# Patient Record
Sex: Male | Born: 1970 | Hispanic: Yes | Marital: Single
Health system: Southern US, Community
[De-identification: ages and names within clinical notes are randomized; demographics above are authoritative.]

## PROBLEM LIST (undated history)

## (undated) DIAGNOSIS — E119 Type 2 diabetes mellitus without complications: Secondary | ICD-10-CM

## (undated) DIAGNOSIS — I10 Essential (primary) hypertension: Secondary | ICD-10-CM

---

## 2004-12-03 ENCOUNTER — Ambulatory Visit: Payer: Self-pay | Admitting: Family Medicine

## 2009-02-04 ENCOUNTER — Emergency Department: Payer: Self-pay | Admitting: Emergency Medicine

## 2012-02-08 ENCOUNTER — Ambulatory Visit: Payer: Self-pay | Admitting: Primary Care

## 2019-07-05 ENCOUNTER — Other Ambulatory Visit: Payer: Self-pay

## 2019-07-05 DIAGNOSIS — Z20822 Contact with and (suspected) exposure to covid-19: Secondary | ICD-10-CM

## 2019-07-10 LAB — NOVEL CORONAVIRUS, NAA: SARS-CoV-2, NAA: NOT DETECTED

## 2020-04-22 ENCOUNTER — Ambulatory Visit: Payer: Self-pay | Attending: Internal Medicine

## 2020-04-22 DIAGNOSIS — Z23 Encounter for immunization: Secondary | ICD-10-CM

## 2020-04-22 NOTE — Progress Notes (Signed)
   Covid-19 Vaccination [facility]  Name:  Bryan Skinner    MRN: [id number] DOB: Jul 18, 1971  04/22/2020  [name] was observed post Covid-19 immunization for 15 minutes without incident. He was provided with Vaccine Information Sheet and instruction to access the V-Safe system.   [name] was instructed to call 911 with any severe reactions post vaccine: Marland Kitchen Difficulty breathing  . Swelling of face and throat  . A fast heartbeat  . A bad rash all over body  . Dizziness and weakness   Immunizations Administered    Name Date Dose VIS Date Route   Pfizer COVID-19 Vaccine 04/22/2020  1:00 PM 0.3 mL 02/13/2019 Intramuscular   Manufacturer: [employer], [employer]   Lot: [id number]   NDC: [address]-3

## 2020-05-13 ENCOUNTER — Ambulatory Visit: Payer: Self-pay | Attending: Internal Medicine

## 2020-05-13 DIAGNOSIS — Z23 Encounter for immunization: Secondary | ICD-10-CM

## 2020-05-13 NOTE — Progress Notes (Signed)
   Covid-19 Vaccination Clinic  Name:  Bryan Skinner    MRN: [id number] DOB: 09-Dec-1971  05/13/2020  Mr. Bryan Skinner was observed post Covid-19 immunization for 15 minutes without incident. He was provided with Vaccine Information Sheet and instruction to access the V-Safe system.   Mr. Bryan Skinner was instructed to call 911 with any severe reactions post vaccine: Marland Kitchen Difficulty breathing  . Swelling of face and throat  . A fast heartbeat  . A bad rash all over body  . Dizziness and weakness   Immunizations Administered    Name Date Dose VIS Date Route   Pfizer COVID-19 Vaccine 05/13/2020 12:43 PM 0.3 mL 02/13/2019 Intramuscular   Manufacturer: [employer], [employer]   Lot: [id number]   NDC: [address]-5

## 2021-12-16 ENCOUNTER — Other Ambulatory Visit: Payer: Self-pay

## 2021-12-16 ENCOUNTER — Emergency Department: Payer: Self-pay

## 2021-12-16 DIAGNOSIS — R110 Nausea: Secondary | ICD-10-CM | POA: Insufficient documentation

## 2021-12-16 DIAGNOSIS — R1013 Epigastric pain: Secondary | ICD-10-CM | POA: Insufficient documentation

## 2021-12-16 DIAGNOSIS — Z5321 Procedure and treatment not carried out due to patient leaving prior to being seen by health care provider: Secondary | ICD-10-CM | POA: Insufficient documentation

## 2021-12-16 LAB — BASIC METABOLIC PANEL
Anion gap: 12 (ref 5–15)
BUN: 16 mg/dL (ref 6–20)
CO2: 23 mmol/L (ref 22–32)
Calcium: 8.6 mg/dL — ABNORMAL LOW (ref 8.9–10.3)
Chloride: 97 mmol/L — ABNORMAL LOW (ref 98–111)
Creatinine, Ser: 0.87 mg/dL (ref 0.61–1.24)
GFR, Estimated: >60 mL/min (ref >60)
Glucose, Bld: 132 mg/dL — ABNORMAL HIGH (ref 70–99)
Potassium: 3.1 mmol/L — ABNORMAL LOW (ref 3.5–5.1)
Sodium: 132 mmol/L — ABNORMAL LOW (ref 135–145)

## 2021-12-16 LAB — CBC
HCT: 49.1 % (ref 39.0–52.0)
Hemoglobin: 16.6 g/dL (ref 13.0–17.0)
MCH: 29.6 pg (ref 26.0–34.0)
MCHC: 33.8 g/dL (ref 30.0–36.0)
MCV: 87.7 fL (ref 80.0–100.0)
Platelets: 256 10*3/uL (ref 150–400)
RBC: 5.6 MIL/uL (ref 4.22–5.81)
RDW: 13.4 % (ref 11.5–15.5)
WBC: 10.3 10*3/uL (ref 4.0–10.5)
nRBC: 0 % (ref 0.0–0.2)

## 2021-12-16 LAB — TROPONIN I (HIGH SENSITIVITY): Troponin I (High Sensitivity): 3 ng/L (ref <18)

## 2021-12-16 LAB — LIPASE, BLOOD: Lipase: 80 U/L — ABNORMAL HIGH (ref 11–51)

## 2021-12-16 MED ORDER — ONDANSETRON HCL 4 MG/2ML IJ SOLN
4.0000 mg | Freq: Once | INTRAMUSCULAR | Status: AC
  Administered 2021-12-16: 19:00:00 4 mg via INTRAVENOUS
  Filled 2021-12-16: qty 2

## 2021-12-16 NOTE — ED Triage Notes (Signed)
Interpreter [phone] Pt to ED POV for epigastric pain radiating to lower abd pain that started this am. +nausea Hx HTN, fatty liver

## 2021-12-16 NOTE — ED Notes (Signed)
Pt vomiting in triage, diaphoretic, pale in color

## 2021-12-16 NOTE — ED Provider Notes (Signed)
Emergency Medicine Provider Triage Evaluation Note  Bryan Skinner , a 50 y.o. male  was evaluated in triage.  Pt complains of epigastric pain that radiates into the lower abdomen with nausea.  Review of Systems  Positive: Nausea, abdominal pain Negative: Fever, vomiting, diarrhea  Physical Exam  BP (!) 152/112 (BP Location: Left Arm)    Pulse (!) 105    Temp 98.7 F (37.1 C) (Oral)    Resp 18    Ht 5\' 3"  (1.6 m)    Wt 80.7 kg    SpO2 93%    BMI 31.53 kg/m  Gen:   Awake, no distress   Resp:  Normal effort  MSK:   Moves extremities without difficulty  Other:    Medical Decision Making  Medically screening exam initiated at 6:39 PM.  Appropriate orders placed.  Bryan Skinner was informed that the remainder of the evaluation will be completed by another provider, this initial triage assessment does not replace that evaluation, and the importance of remaining in the ED until their evaluation is complete.   Roderic Scarce, FNP 12/16/21 1840    12/18/21, MD 12/16/21 2117

## 2021-12-17 ENCOUNTER — Emergency Department
Admission: EM | Admit: 2021-12-17 | Discharge: 2021-12-17 | Discharge status: Against Medical Advice | Payer: Self-pay | Attending: Emergency Medicine | Admitting: Emergency Medicine

## 2021-12-17 ENCOUNTER — Emergency Department: Payer: Self-pay

## 2021-12-17 HISTORY — DX: Essential (primary) hypertension: I10

## 2021-12-17 HISTORY — DX: Type 2 diabetes mellitus without complications: E11.9

## 2021-12-17 LAB — HEPATIC FUNCTION PANEL
ALT: 54 U/L — ABNORMAL HIGH (ref 0–44)
AST: 26 U/L (ref 15–41)
Albumin: 4.6 g/dL (ref 3.5–5.0)
Alkaline Phosphatase: 85 U/L (ref 38–126)
Bilirubin, Direct: 0.1 mg/dL (ref 0.0–0.2)
Indirect Bilirubin: 0.6 mg/dL (ref 0.3–0.9)
Total Bilirubin: 0.7 mg/dL (ref 0.3–1.2)
Total Protein: 8.4 g/dL — ABNORMAL HIGH (ref 6.5–8.1)

## 2023-06-22 IMAGING — US US ABDOMEN LIMITED
1 series · 14 of 25 positions shown · non-contrast
Comparison: None.

CLINICAL DATA: Epigastric pain

EXAM:
ULTRASOUND ABDOMEN LIMITED RIGHT UPPER QUADRANT

[Series 1: us abdomen limited ruq (liver/gb) · 14 of 28 slices shown]
[im 1/28]
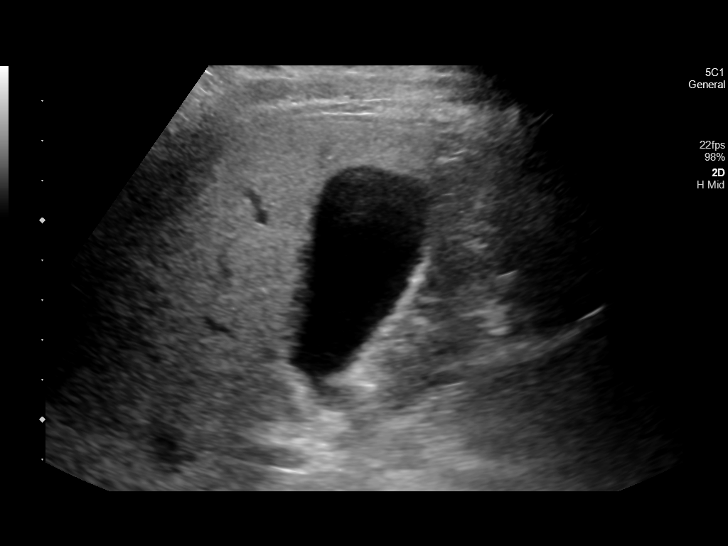
[im 3/28]
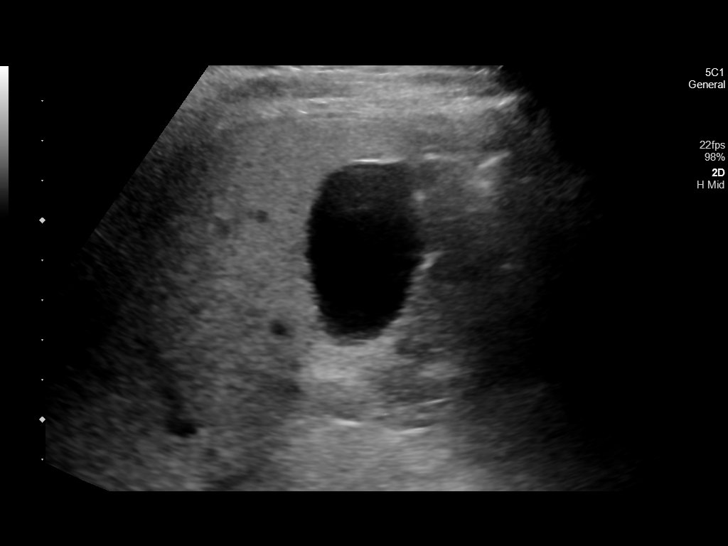
[im 5/28]
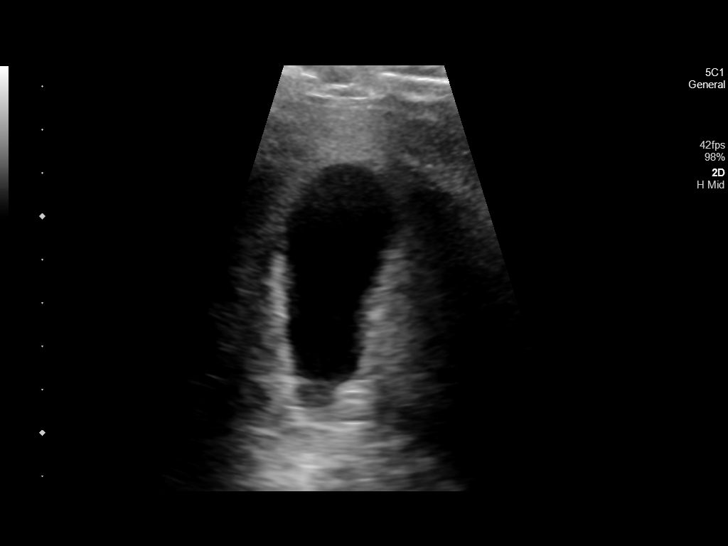
[im 7/28]
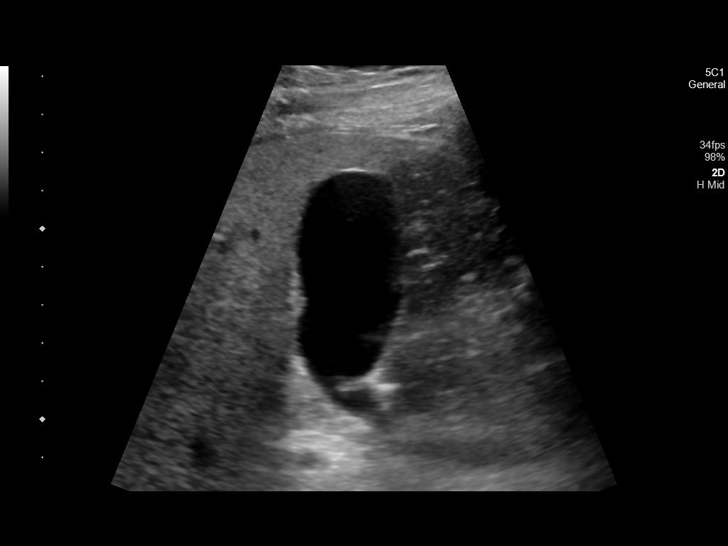
[im 10/28]
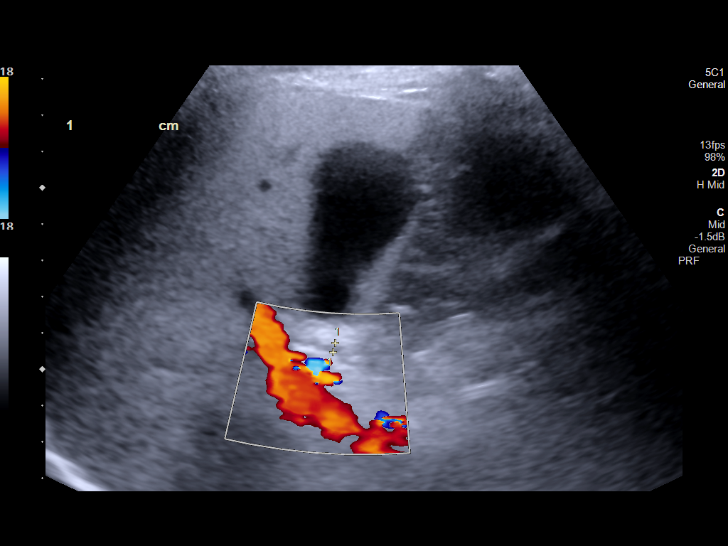
[im 11/28]
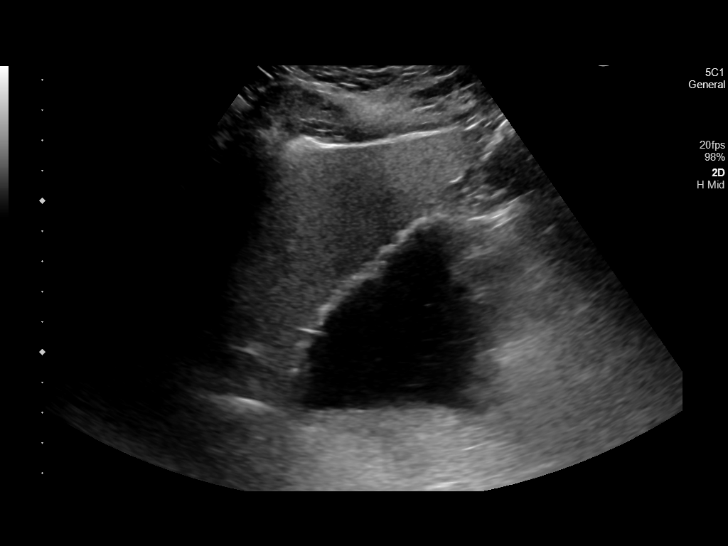
[im 13/28]
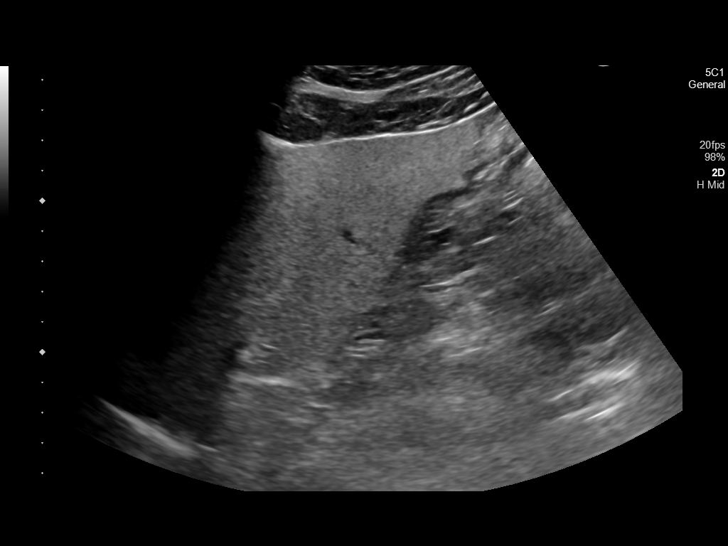
[im 15/28]
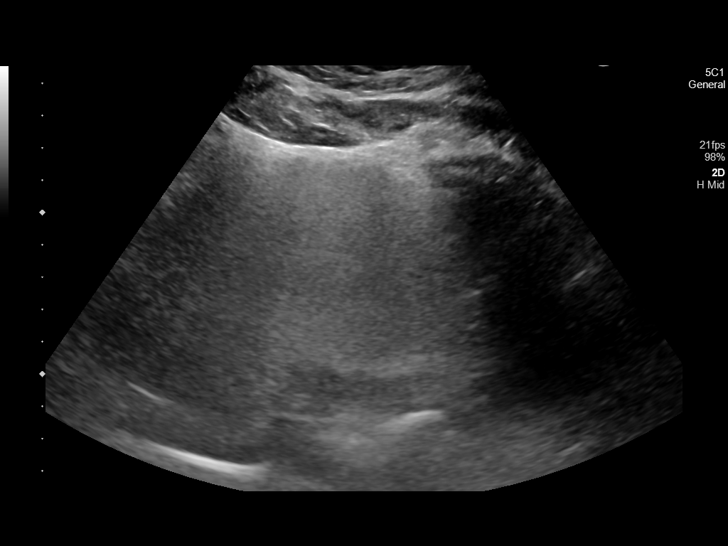
[im 17/28]
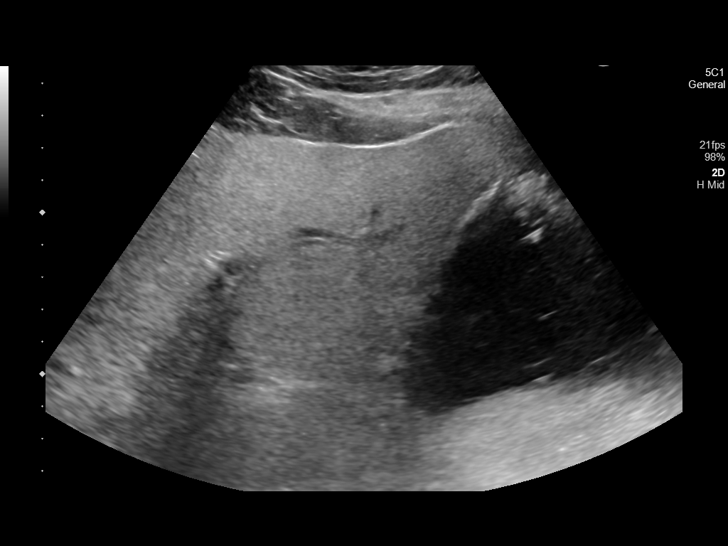
[im 19/28]
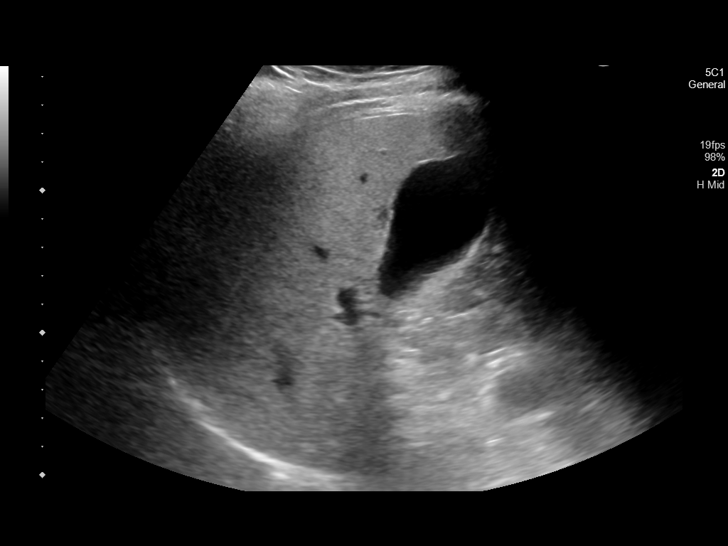
[im 21/28]
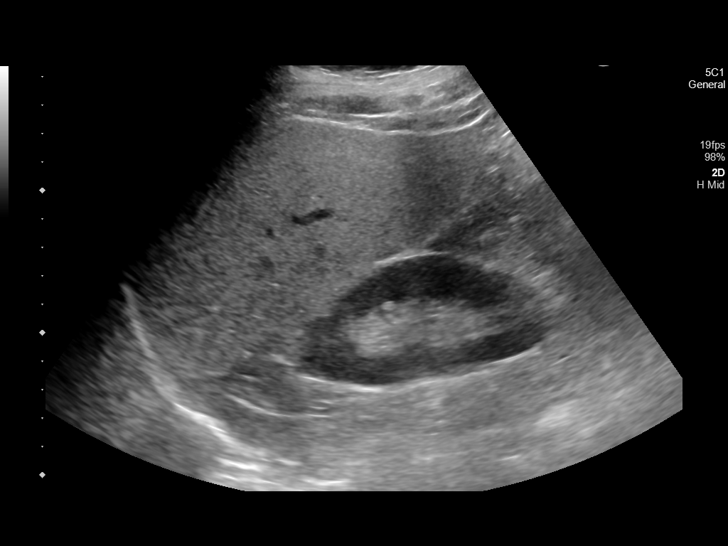
[im 23/28]
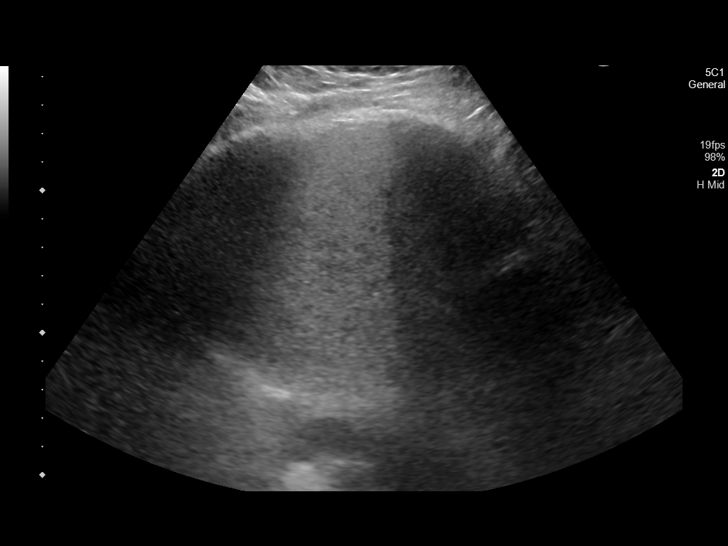
[im 25/28]
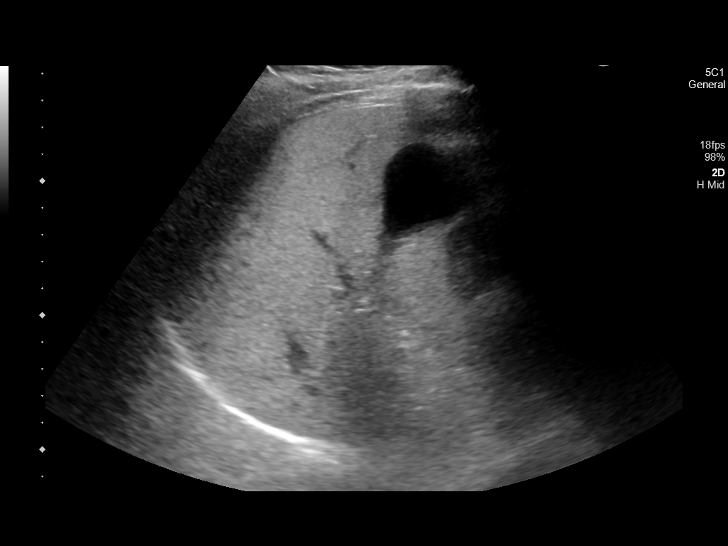
[im 28/28]
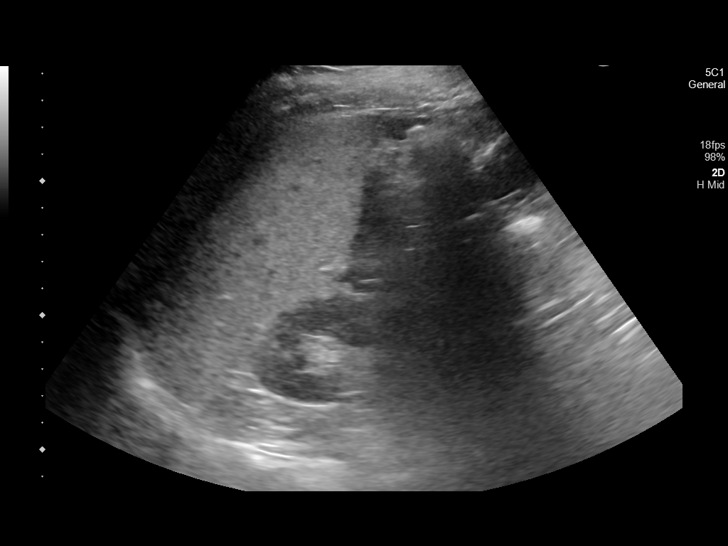

[14 of 25 positions shown; findings below may reference images not displayed]

FINDINGS: Gallbladder:

No gallstones or wall thickening visualized. No sonographic Murphy
sign noted by sonographer.

Common bile duct:

Diameter: 3 mm

Liver:

Hyperechoic liver parenchyma. Portal vein is patent on color Doppler
imaging with normal direction of blood flow towards the liver.

Other: None.
IMPRESSION: 1. No cholelithiasis or other evidence of acute cholecystitis.
2. Hepatic steatosis.
